# Patient Record
Sex: Female | Born: 1985 | Race: Black or African American | Hispanic: No | Marital: Single | Smoking: Never smoker
Health system: Southern US, Community
[De-identification: ages and names within clinical notes are randomized; demographics above are authoritative.]

## PROBLEM LIST (undated history)

## (undated) HISTORY — PX: COSMETIC SURGERY: SHX468

---
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
## 2007-11-30 ENCOUNTER — Ambulatory Visit (HOSPITAL_COMMUNITY): Admission: EM | Admit: 2007-11-30 | Discharge: 2007-11-30 | Payer: Self-pay | Admitting: Emergency Medicine

## 2009-05-26 ENCOUNTER — Inpatient Hospital Stay (HOSPITAL_COMMUNITY): Admission: AD | Admit: 2009-05-26 | Discharge: 2009-05-27 | Payer: Self-pay | Admitting: Obstetrics & Gynecology

## 2010-01-24 ENCOUNTER — Inpatient Hospital Stay (HOSPITAL_COMMUNITY)
Admission: AD | Admit: 2010-01-24 | Discharge: 2010-01-27 | Payer: Self-pay | Source: Home / Self Care | Attending: Obstetrics and Gynecology | Admitting: Obstetrics and Gynecology

## 2010-01-28 NOTE — Discharge Summary (Signed)
  Stephanie Gibbs, Stephanie Gibbs               ACCOUNT NO.:  [id number]  MEDICAL RECORD NO.:  [id number]          PATIENT TYPE:  INP  LOCATION:  9134                          FACILITY:  [facility]  PHYSICIAN:  Huel Cote, M.D. DATE OF BIRTH:  October 08, 1985  DATE OF ADMISSION:  01/24/2010 DATE OF DISCHARGE:  01/27/2010                              DISCHARGE SUMMARY   DISCHARGE DIAGNOSES: 1. Term pregnancy at 80 plus weeks' delivered. 2. Status post normal spontaneous vaginal delivery.  DISCHARGE MEDICATIONS: 1. Motrin 600 mg p.o. every 6 h. 2. Percocet 1-2 tablets p.o. every 4 h. p.r.n.  DISCHARGE FOLLOWUP:  The patient is to follow up in the office in 6 weeks for her full postpartum exam.  HOSPITAL COURSE:  The patient is a 25 year old, G1, P0, who came in at 71 weeks' gestation when she was in latent phase labor.  She had actually been instructed to report from the office; however, went home and came in approximately 5 hours later.  Her prenatal care was essentially uncomplicated except for an isolated echogenic intracardiac focus.  PRENATAL LABORATORY DATA:  Are as follows; O positive, antibody negative, RPR nonreactive, rubella immune, hepatitis B surface antigen negative, HIV negative, GC negative, Chlamydia negative, hemoglobin electrophoresis AA.  Her first trimester screen was normal.  Her AFP was normal, 1-hour Glucola normal at 89 and group B strep was negative.  PAST MEDICAL HISTORY:  Significant for eczema.  PAST SURGICAL HISTORY:  Breast reduction.  PAST OBSTETRICAL HISTORY:  None.  On admission, she was afebrile with stable vital signs.  Fetal heart rate was reactive.  Cervix was 4 and 80 at a -2.  She had rupture of membranes after admission and had been augmented with Pitocin.  She over 2 hours very quickly progressed to complete dilation and pushed well. She had normal spontaneous vaginal delivery of a viable female infant, Apgars were 8 and 9, weight was 6 pounds 8  ounces.  She had a small first-degree laceration repaired with 3-0 Vicryl and a small anterior laceration on the cervix which did not require any suturing and was hemostatic.  She was then kept for routine postpartum care.  On postpartum day #1, she was doing well with no complaints.  Fundus was firm.  On postpartum day #2, her lochia had decreased substantially. She has remained afebrile with stable vital signs and was felt stable for discharge home.  She was given instructions on pelvic rest and to call the office next week to set up her 6-week postpartum visit.     Huel Cote, M.D.     KR/MEDQ  D:  01/27/2010  T:  01/27/2010  Job:  [id number]  Electronically Signed by Huel Cote M.D. on 01/28/2010 10:22:20 AM

## 2010-01-29 LAB — CBC
MCH: 27.7 pg (ref 26.0–34.0)
MCHC: 33.3 g/dL (ref 30.0–36.0)
Platelets: 209 10*3/uL (ref 150–400)
RBC: 4.44 MIL/uL (ref 3.87–5.11)

## 2010-03-26 LAB — URINALYSIS, ROUTINE W REFLEX MICROSCOPIC
Bilirubin Urine: NEGATIVE
Glucose, UA: 100 mg/dL — AB
Nitrite: NEGATIVE
Protein, ur: NEGATIVE mg/dL
Specific Gravity, Urine: >1.03 — ABNORMAL HIGH (ref 1.005–1.030)
pH: 6 (ref 5.0–8.0)

## 2010-03-26 LAB — ABO/RH: ABO/RH(D): O POS

## 2010-03-26 LAB — CBC
HCT: 34.7 % — ABNORMAL LOW (ref 36.0–46.0)
Hemoglobin: 11.7 g/dL — ABNORMAL LOW (ref 12.0–15.0)
Platelets: 179 10*3/uL (ref 150–400)
RBC: 4.08 MIL/uL (ref 3.87–5.11)
RDW: 13.4 % (ref 11.5–15.5)
WBC: 4.1 10*3/uL (ref 4.0–10.5)

## 2010-03-26 LAB — WET PREP, GENITAL
Trich, Wet Prep: NONE SEEN
Yeast Wet Prep HPF POC: NONE SEEN

## 2010-03-26 LAB — POCT PREGNANCY, URINE: Preg Test, Ur: POSITIVE

## 2010-05-22 NOTE — Op Note (Signed)
[name], Stephanie Gibbs               ACCOUNT NO.:  [id number]   MEDICAL RECORD NO.:  [id number]          PATIENT TYPE:  INP   LOCATION:  0103                         FACILITY:  [facility]   PHYSICIAN:  Etter Sjogren, M.D.     DATE OF BIRTH:  05-04-1985   DATE OF PROCEDURE:  11/30/2007  DATE OF DISCHARGE:                               OPERATIVE REPORT   PREOPERATIVE DIAGNOSIS:  Left breast hematoma, status post bilateral  breast reduction two weeks ago.   POSTOPERATIVE DIAGNOSIS:  Left breast hematoma, status post bilateral  breast reduction two weeks ago.   PROCEDURE PERFORMED:  Incision and drainage of a left breast hematoma.   SURGEON:  Etter Sjogren, M.D.   ANESTHESIA:  General.   ESTIMATED BLOOD LOSS:  Minimal with the exception of the evacuation of  the hematoma.   CLINICAL NOTE:  A 25 year old woman had a breast reduction almost 2  weeks ago.  She has done extremely well.  She was running for her cell  phone last night and felt a sudden pain in her left breast and developed  immediate swelling in the left breast.  On evaluation, it is felt that  she did have a hematoma.  It was very soft.   The procedure and risks, plus complications, were discussed with her in  great detail, including but not limited to, bleeding, infection,  anesthesia complications, healing problems, scarring, damage to nipple,  loss of sensation, asymmetry, poor results, and further bleeding.  She  understood all of this and the importance of limiting her activities.   PROCEDURE:  Patient was taken to the operating room and placed supine.  After a successful induction of general anesthesia, prepped with  Betadine and draped with sterile drapes.  The lateral aspect of the  incision was opened, and a fairly significant hematoma was noted.  The  medial aspect as well as the vertical T portion opened so there could be  good visualization all the way around the breast nipple pedicle.  The  hematoma was  evacuated.  It was approximately 250 to 300 cc of blood.  Thorough irrigation with saline.  Meticulous hemostasis with Bovie  electrocautery.  There was no frank bleeding vessel found, just some  punctate bleeding from the opening.  After careful inspection and after  thoroughly irrigating with saline and meticulous hemostasis with  electrocautery, a Blake drain was positioned and brought through a  separate stab wound inferolaterally and secured with 3-0 Prolene suture.  Nipple complex and all skin edges looked very healthy, and closure with  3-0 Monocryl interrupted deep dermal sutures, 3-0 Monocryl running  subcuticular sutures.  Steri-Strips, dry sterile dressing, and a  circumferential Ace wrap applied.  She was transferred to the recovery  room stable, having tolerated the procedure well.   DISPOSITION:  She will call the office for an appointment in a week.  She will be on antibiotics, Keflex 5 mg p.o. q.i.d., and we will see her  back in the office next week.      Etter Sjogren, M.D.  Electronically Signed     DB/MEDQ  D:  11/30/2007  T:  11/30/2007  Job:  [id number]

## 2010-10-10 LAB — DIFFERENTIAL
Basophils Absolute: 0
Basophils Relative: 1
Lymphs Abs: 1.4
Monocytes Absolute: 0.3
Monocytes Relative: 5
Neutrophils Relative %: 72

## 2010-10-10 LAB — CBC
Hemoglobin: 10.6 — ABNORMAL LOW
MCHC: 33.1
MCV: 84.4
Platelets: 261
RDW: 13.3

## 2010-10-10 LAB — URINALYSIS, ROUTINE W REFLEX MICROSCOPIC
Bilirubin Urine: NEGATIVE
Glucose, UA: NEGATIVE
Hgb urine dipstick: NEGATIVE
Ketones, ur: NEGATIVE
Nitrite: NEGATIVE
Specific Gravity, Urine: 1.025

## 2010-10-10 LAB — POCT I-STAT, CHEM 8
Calcium, Ion: 1.18
HCT: 33 — ABNORMAL LOW
Hemoglobin: 11.2 — ABNORMAL LOW
TCO2: 23

## 2011-01-06 IMAGING — US US OB COMP LESS 14 WK
1 series · 13 of 25 positions shown · non-contrast
Comparison: None.

CLINICAL DATA: Abdominal cramping for 3 weeks; persistent headache
and back pain.  Vaginal discharge.

OBSTETRIC <14 WK US AND TRANSVAGINAL OB US
TECHNIQUE: Both transabdominal and transvaginal ultrasound
examinations were performed for complete evaluation of the
gestation as well as the maternal uterus, adnexal regions, and
pelvic cul-de-sac.

[Series 1: us ob comp less 14 wks · 0.21mm/px · 13 of 25 slices shown]
[im 1/25]
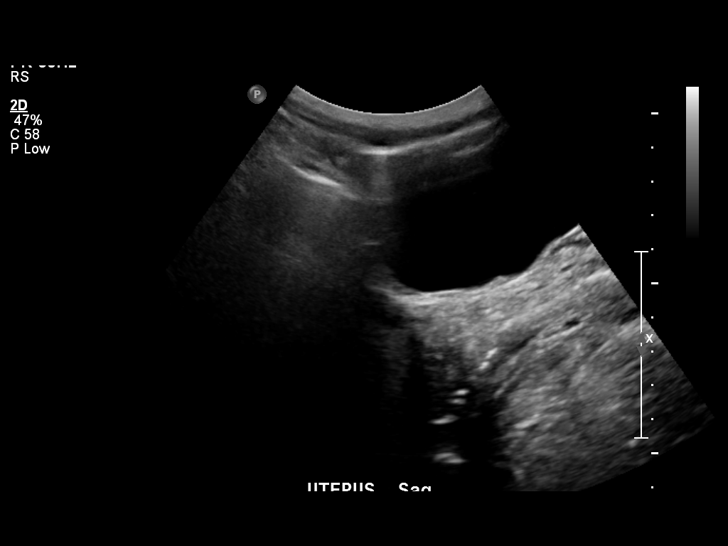
[im 3/25]
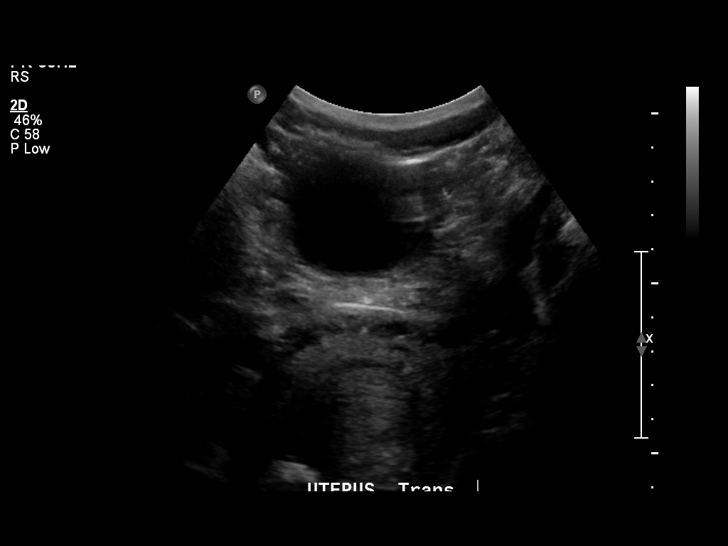
[im 5/25]
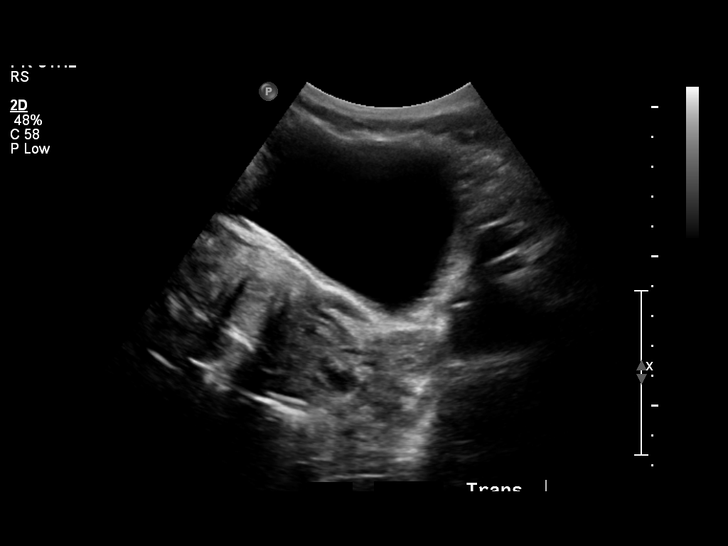
[im 7/25]
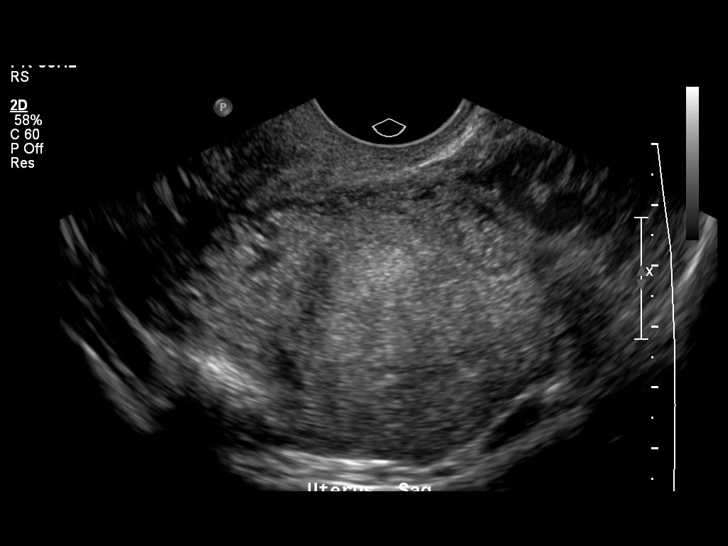
[im 9/25]
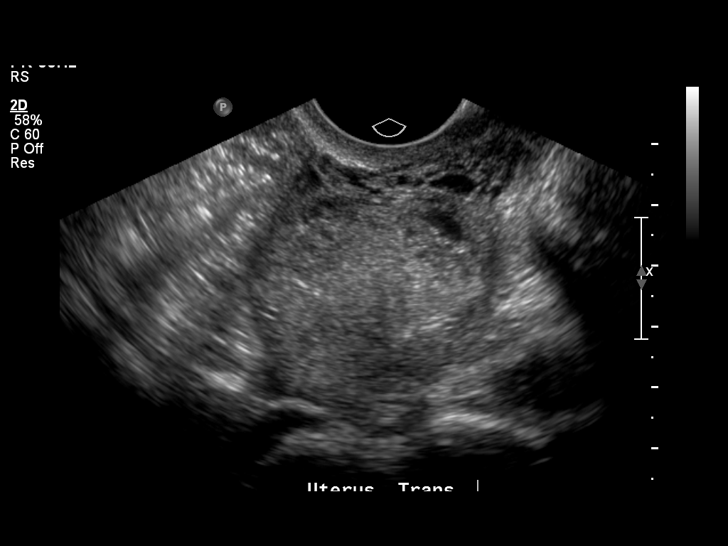
[im 11/25]
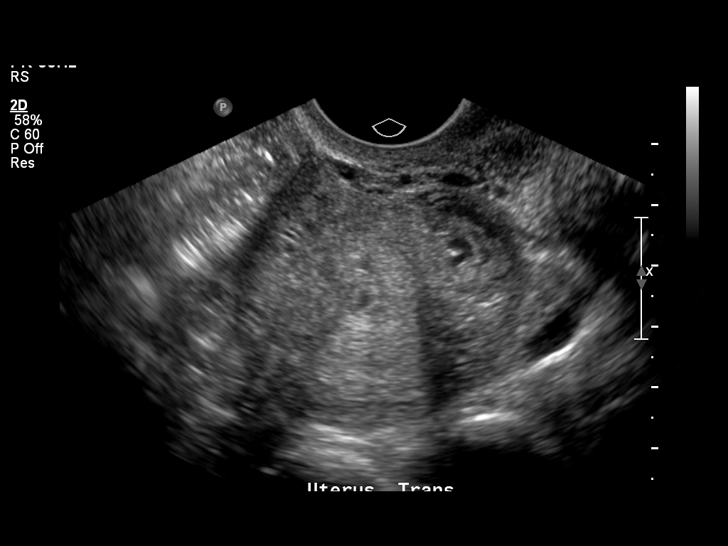
[im 13/25]
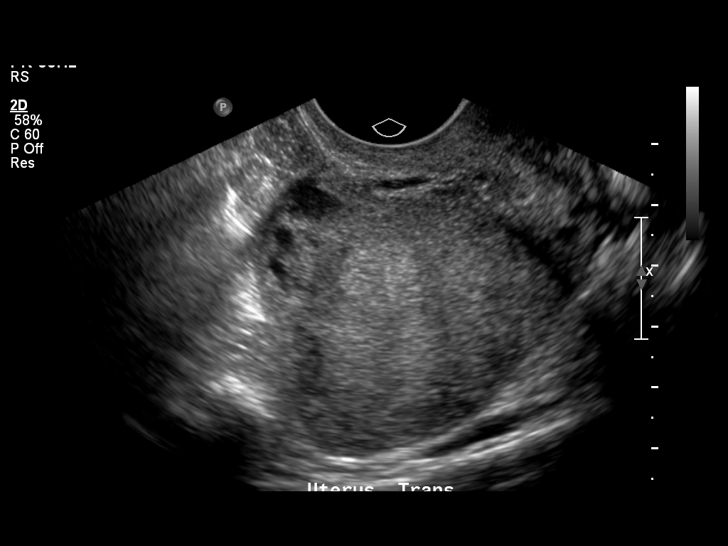
[im 15/25]
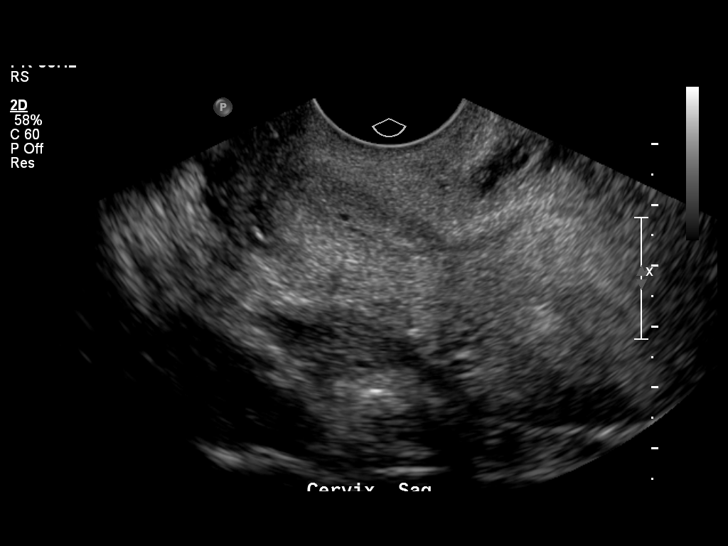
[im 17/25]
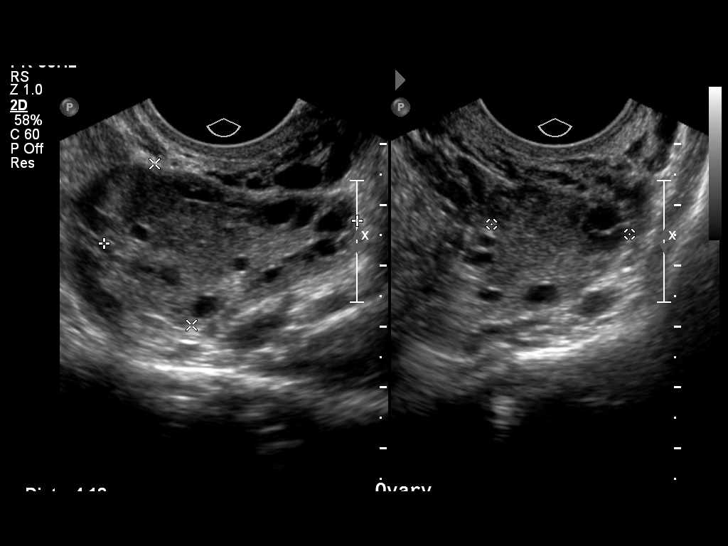
[im 19/25]
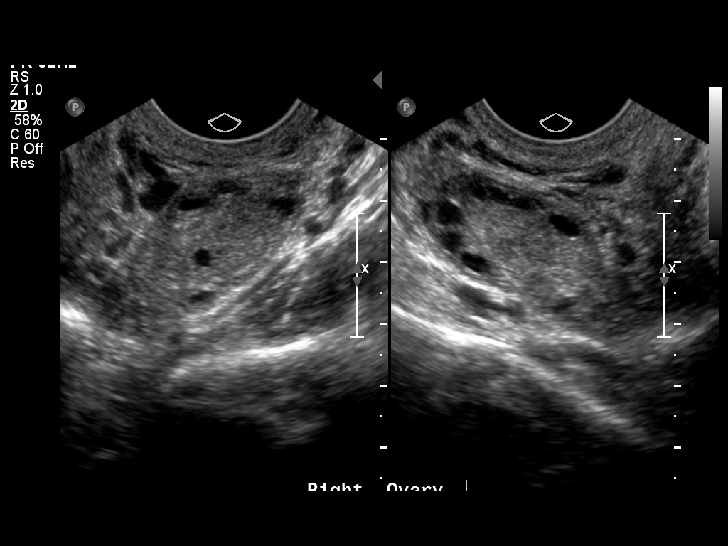
[im 21/25]
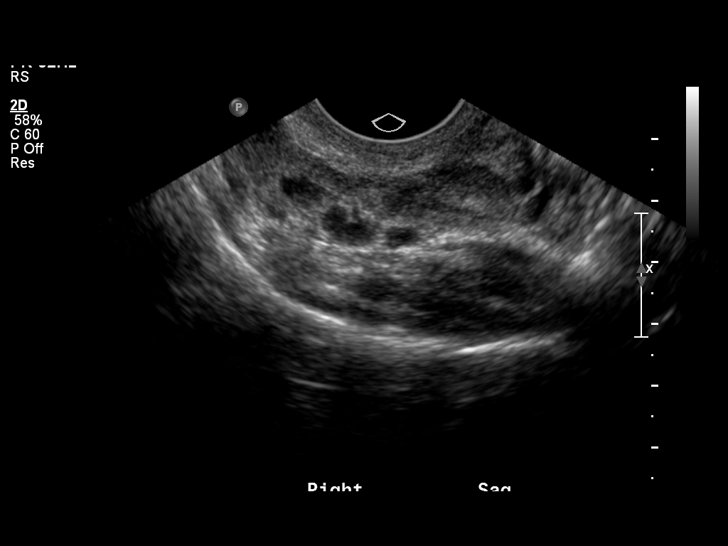
[im 23/25]
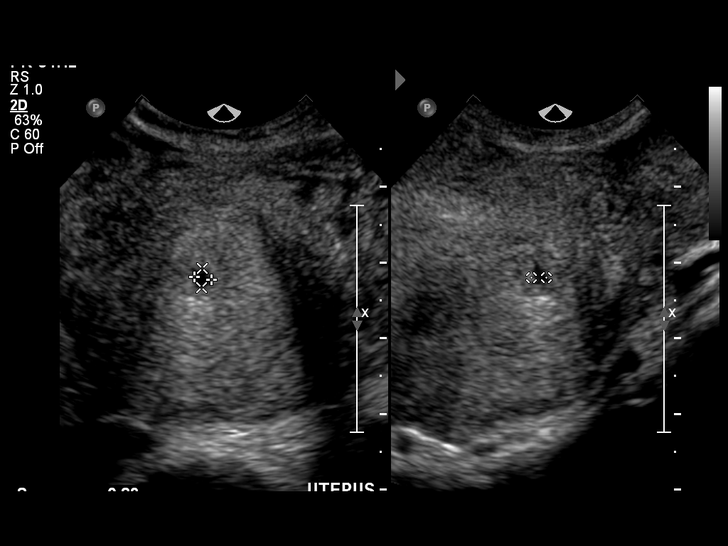
[im 25/25]
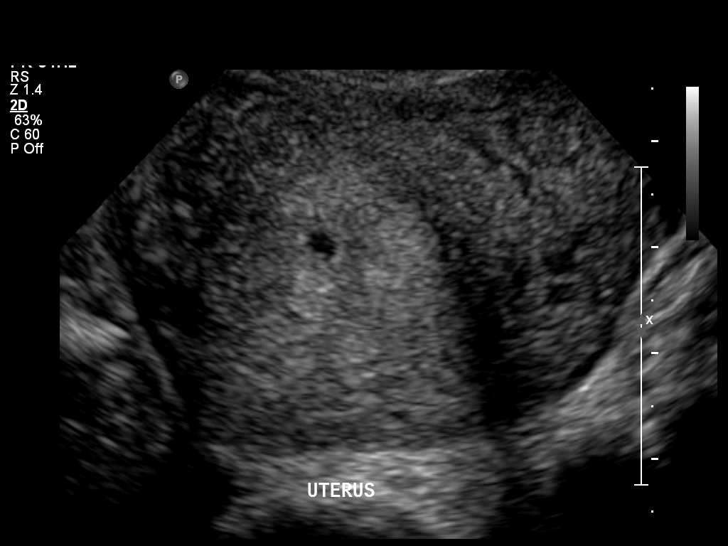

[13 of 25 positions shown; findings below may reference images not displayed]

FINDINGS: A single likely intrauterine gestational sac is identified.  It
measures 2.3 mm in mean sac diameter, corresponding to a
gestational age of 4 weeks 5 days.  This does not match the
patient's expected gestational age of 8 weeks 6 days by LMP.  A
possible yolk sac is noted.   An embryo is not yet seen.

This could simply reflect inaccurate dates; suggest following
serial beta HCGs to ensure that levels continue to rise. Myometrial
echogenicity is within normal limits.  No fibroids are identified.
There is no evidence of subchorionic hemorrhage.

The ovaries are unremarkable in appearance.  The right ovary
measures 3.0 x 1.8 x 2.3 cm, while the left ovary measures 4.2 x
2.7 x 2.3 cm.  No suspicious adnexal masses are seen.  There is no
evidence for ovarian torsion.

No free fluid is seen within the pelvic cul-de-sac.
IMPRESSION: Single likely intrauterine gestational sac noted, measuring 2.3 mm
in mean sac diameter, corresponding to a gestational age of 4 weeks
5 days.  This does not match the patient's expected gestational age
by LMP.

It could simply reflect inaccurate dates; suggest following serial
beta HCGs to ensure that levels continue to rise.  This gestational
age reflects an estimated date of delivery January 29, 2010.

## 2011-01-10 ENCOUNTER — Encounter (HOSPITAL_COMMUNITY): Payer: Self-pay | Admitting: Obstetrics and Gynecology

## 2011-01-10 ENCOUNTER — Inpatient Hospital Stay (HOSPITAL_COMMUNITY)
Admission: AD | Admit: 2011-01-10 | Discharge: 2011-01-10 | Disposition: A | Discharge status: Home / Self Care | Payer: Self-pay | Source: Ambulatory Visit | Attending: Obstetrics & Gynecology | Admitting: Obstetrics & Gynecology

## 2011-01-10 DIAGNOSIS — A088 Other specified intestinal infections: Secondary | ICD-10-CM | POA: Insufficient documentation

## 2011-01-10 DIAGNOSIS — K5289 Other specified noninfective gastroenteritis and colitis: Secondary | ICD-10-CM

## 2011-01-10 DIAGNOSIS — K529 Noninfective gastroenteritis and colitis, unspecified: Secondary | ICD-10-CM

## 2011-01-10 LAB — URINALYSIS, ROUTINE W REFLEX MICROSCOPIC
Glucose, UA: NEGATIVE mg/dL
Hgb urine dipstick: NEGATIVE
Leukocytes, UA: NEGATIVE
Nitrite: NEGATIVE
Protein, ur: NEGATIVE mg/dL
Urobilinogen, UA: 0.2 mg/dL (ref 0.0–1.0)

## 2011-01-10 MED ORDER — PROMETHAZINE HCL 25 MG PO TABS: 25.0000 mg | ORAL_TABLET | Freq: Four times a day (QID) | ORAL | Status: AC | PRN

## 2011-01-10 MED ORDER — DIPHENOXYLATE-ATROPINE 2.5-0.025 MG PO TABS: 1.0000 | ORAL_TABLET | Freq: Four times a day (QID) | ORAL | Status: AC | PRN

## 2011-01-10 MED ORDER — ONDANSETRON 8 MG PO TBDP
8.0000 mg | ORAL_TABLET | Freq: Once | ORAL | Status: AC
  Administered 2011-01-10: 8 mg via ORAL
  Filled 2011-01-10: qty 1

## 2011-01-10 NOTE — Progress Notes (Signed)
Pt  Re-assessed - says vomited x5 at home-  No vomiting here, no diarrhea, NO FEVER.Marland Kitchen  BACK TO LOBBY.

## 2011-01-10 NOTE — Progress Notes (Signed)
PT  BROUGHT TO TRIAGE - WANTING TO KNOW- WHEN SHE WOULD BE SEED.  SHE SAYS  SHE FEELS BETTER- HAS NOT VOMITED / DIARRHEA- SINCE 4 PM.  MAY LEAVE TO PICK UP CHILD.

## 2011-01-10 NOTE — Progress Notes (Signed)
ASKING QUESTIONS ABOUT  N/V/D- HAS HAD NONE   SAYS NEEDS TO LEAVE  BY 10PM   TO PICKUP CHILD.Marland Kitchen  BACK TO LOBBY

## 2011-01-10 NOTE — ED Provider Notes (Signed)
History     Chief Complaint  Patient presents with  . Diarrhea  . Emesis   HPI N/V/D since 6 AM, feeling better now. No fever or chills.   No past medical history on file.  No past surgical history on file.  No family history on file.  History  Substance Use Topics  . Smoking status: Not on file  . Smokeless tobacco: Not on file  . Alcohol Use: Not on file    Allergies: No Known Allergies  No prescriptions prior to admission    Review of Systems  Constitutional: Negative.   Respiratory: Negative.   Cardiovascular: Negative.   Gastrointestinal: Positive for nausea, vomiting, abdominal pain and diarrhea. Negative for constipation.  Genitourinary: Negative for dysuria, urgency, frequency, hematuria and flank pain.       Negative for vaginal bleeding, vaginal discharge, dyspareunia  Musculoskeletal: Negative.   Neurological: Negative.   Psychiatric/Behavioral: Negative.    Physical Exam   Blood pressure 120/81, pulse 82, temperature 98.8 F (37.1 C), temperature source Oral, resp. rate 18, last menstrual period 01/06/2011.  Physical Exam  Nursing note and vitals reviewed. Constitutional: She is oriented to person, place, and time. She appears well-developed and well-nourished. No distress.  Cardiovascular: Normal rate and regular rhythm.   Respiratory: Effort normal.  GI: Soft. Bowel sounds are normal. She exhibits no distension and no mass. There is no tenderness. There is no rebound and no guarding.  Musculoskeletal: Normal range of motion.  Neurological: She is alert and oriented to person, place, and time.  Skin: Skin is warm and dry.  Psychiatric: She has a normal mood and affect.    MAU Course  Procedures  Results for orders placed during the hospital encounter of 01/10/11 (from the past 24 hour(s))  URINALYSIS, ROUTINE W REFLEX MICROSCOPIC     Status: Abnormal   Collection Time   01/10/11  6:30 PM      Component Value Range   Color, Urine YELLOW   YELLOW    APPearance CLEAR  CLEAR    Specific Gravity, Urine >1.030 (*) 1.005 - 1.030    pH 6.0  5.0 - 8.0    Glucose, UA NEGATIVE  NEGATIVE (mg/dL)   Hgb urine dipstick NEGATIVE  NEGATIVE    Bilirubin Urine SMALL (*) NEGATIVE    Ketones, ur 40 (*) NEGATIVE (mg/dL)   Protein, ur NEGATIVE  NEGATIVE (mg/dL)   Urobilinogen, UA 0.2  0.0 - 1.0 (mg/dL)   Nitrite NEGATIVE  NEGATIVE    Leukocytes, UA NEGATIVE  NEGATIVE   POCT PREGNANCY, URINE     Status: Normal   Collection Time   01/10/11  6:38 PM      Component Value Range   Preg Test, Ur NEGATIVE     Tolerating PO fluids after Zofran ODT   Assessment and Plan  26 y.o. [id number] with viral gastroenteritis Rx phenergan and lomotil F/U PRN  Sixto Bowdish 01/11/2011, 1:42 AM

## 2011-01-10 NOTE — Progress Notes (Signed)
Pt Presents to MAU with chief complaint of N/V this morning. Pt says she had several episodes >5 of vomiting and diarrhea. No fever; unsure if its food related or virus. No vomiting/diarrhea since here in MAU, no fever.

## 2011-01-10 NOTE — Progress Notes (Addendum)
Started @ 6a Vomiting and nausea this AM x 4-5 times, diarrhea, x 2, ate last night @ 2000 Pain: body aches, pain scale 7/10, feels chills off and on, afebrile

## 2011-01-11 NOTE — ED Provider Notes (Signed)
Attestation of Attending Supervision of Advanced Practitioner: Evaluation and management procedures were performed by the PA/NP/CNM/OB Fellow under my supervision/collaboration. Chart reviewed, and agree with management and plan.  Britney Newstrom, M.D. 01/11/2011 6:38 AM   

## 2012-03-24 ENCOUNTER — Encounter: Payer: Self-pay | Admitting: Certified Nurse Midwife

## 2012-04-07 ENCOUNTER — Ambulatory Visit (INDEPENDENT_AMBULATORY_CARE_PROVIDER_SITE_OTHER): Payer: 59 | Admitting: Physician Assistant

## 2012-04-07 VITALS — BP 110/80 | HR 66 | Temp 98.1°F | Resp 18 | Ht 64.0 in | Wt 103.0 lb

## 2012-04-07 DIAGNOSIS — Z2089 Contact with and (suspected) exposure to other communicable diseases: Secondary | ICD-10-CM

## 2012-04-07 DIAGNOSIS — Z202 Contact with and (suspected) exposure to infections with a predominantly sexual mode of transmission: Secondary | ICD-10-CM

## 2012-04-07 DIAGNOSIS — N760 Acute vaginitis: Secondary | ICD-10-CM

## 2012-04-07 DIAGNOSIS — R350 Frequency of micturition: Secondary | ICD-10-CM

## 2012-04-07 LAB — POCT WET PREP WITH KOH
KOH Prep POC: NEGATIVE
Trichomonas, UA: NEGATIVE
Yeast Wet Prep HPF POC: NEGATIVE

## 2012-04-07 LAB — POCT URINALYSIS DIPSTICK
Bilirubin, UA: NEGATIVE
Blood, UA: NEGATIVE
Glucose, UA: NEGATIVE
Ketones, UA: NEGATIVE
Leukocytes, UA: NEGATIVE
Nitrite, UA: NEGATIVE
Protein, UA: NEGATIVE
Spec Grav, UA: 1.02
Urobilinogen, UA: 0.2
pH, UA: 7.5

## 2012-04-07 LAB — POCT UA - MICROSCOPIC ONLY
Casts, Ur, LPF, POC: NEGATIVE
Crystals, Ur, HPF, POC: NEGATIVE
Mucus, UA: NEGATIVE
Yeast, UA: NEGATIVE

## 2012-04-07 MED ORDER — METRONIDAZOLE 500 MG PO TABS: 500.0000 mg | ORAL_TABLET | Freq: Two times a day (BID) | ORAL | Status: DC

## 2012-04-07 NOTE — Progress Notes (Signed)
Subjective:    Patient ID: Stephanie Gibbs, female    DOB: 11-Nov-1985, 27 y.o.   MRN: [id number]  HPI   Stephanie Gibbs is a very pleasant 27 yr old female here with concern about vaginal discharge and requesting STD testing.  States she "made a bad decision a few weeks ago."  Had unprotected sex about 6 weeks ago.  States she has not felt the same since that time.  Has noted some pain in her left side, but most prominently has noticed a vaginal odor.  Has noted the odor since the encounter 6 weeks ago.  Could not get an appt with her ob/gyn for 2 weeks, which prompted her to come here.  Has noted a little more vaginal discharge than usual.  She did not use a condom with the last sexual encounter.  She is not using any form of contraception.  LMP about 03/24/12, normal.  Denies abd, vaginal, or pelvic pain.  Has noted some urinary frequency but no dysuria or hematuria.      Review of Systems  Constitutional: Negative for fever and chills.  HENT: Negative.   Respiratory: Negative.   Cardiovascular: Negative.   Gastrointestinal: Negative.   Genitourinary: Positive for frequency, flank pain and vaginal discharge. Negative for dysuria, hematuria and pelvic pain.  Musculoskeletal: Negative.   Skin: Negative.   Neurological: Negative.        Objective:   Physical Exam  Vitals reviewed. Constitutional: She is oriented to person, place, and time. She appears well-developed and well-nourished. No distress.  HENT:  Head: Normocephalic and atraumatic.  Eyes: Conjunctivae are normal. No scleral icterus.  Cardiovascular: Normal rate, regular rhythm and normal heart sounds.   Pulmonary/Chest: Effort normal and breath sounds normal. She has no wheezes. She has no rales.  Abdominal: Soft. Bowel sounds are normal. There is no tenderness. There is no CVA tenderness.  Genitourinary: Uterus normal. There is no rash, tenderness or lesion on the right labia. There is no rash, tenderness or lesion on the left  labia. Cervix exhibits no motion tenderness, no discharge and no friability. Right adnexum displays no mass, no tenderness and no fullness. Left adnexum displays no mass, no tenderness and no fullness. Vaginal discharge (copious, yellow/white) found.  Neurological: She is alert and oriented to person, place, and time.  Skin: Skin is warm and dry.  Psychiatric: She has a normal mood and affect. Her behavior is normal.     Filed Vitals:   04/07/12 1045  BP: 110/80  Pulse: 66  Temp: 98.1 F (36.7 C)  Resp: 18     Results for orders placed in visit on 04/07/12  POCT URINALYSIS DIPSTICK      Result Value Range   Color, UA yellow     Clarity, UA clear     Glucose, UA neg     Bilirubin, UA neg     Ketones, UA neg     Spec Grav, UA 1.020     Blood, UA neg     pH, UA 7.5     Protein, UA neg     Urobilinogen, UA 0.2     Nitrite, UA neg     Leukocytes, UA Negative    POCT UA - MICROSCOPIC ONLY      Result Value Range   WBC, Ur, HPF, POC 0-1     RBC, urine, microscopic 0-1     Bacteria, U Microscopic trace     Mucus, UA neg     Epithelial  cells, urine per micros 1-3     Crystals, Ur, HPF, POC neg     Casts, Ur, LPF, POC neg     Yeast, UA neg    POCT WET PREP WITH KOH      Result Value Range   Trichomonas, UA Negative     Clue Cells Wet Prep HPF POC 0-2     Epithelial Wet Prep HPF POC 3-6     Yeast Wet Prep HPF POC neg     Bacteria Wet Prep HPF POC 1+     RBC Wet Prep HPF POC 0-2     WBC Wet Prep HPF POC 8-12     KOH Prep POC Negative    POCT URINE PREGNANCY      Result Value Range   Preg Test, Ur Negative          Assessment & Plan:  Bacterial vaginosis - Plan: metroNIDAZOLE (FLAGYL) 500 MG tablet  -- Wet prep with few clue cells.  Given symptoms of vaginal discharge and odor, will treat with Flagyl x 7 days.  Discussed avoidance of etoh.  If symptoms fail to improve, pt will RTC.    Exposure to STD - Plan: POCT Wet Prep with KOH, POCT urine pregnancy, HIV antibody,  GC/Chlamydia Probe Amp, RPR  -- Genprobe, RPR, and HIV pending.  Will follow up and treat if necessary.  Discussed condom use with pt.  Frequency of urination - Plan: POCT urinalysis dipstick, POCT UA - Microscopic Only  -- UA does not indicate infection.  Etiology of urinary frequency is unclear.  If this persists, may need to evaluate further  Discussed all of this with pt who understands and is in agreement with this plan.

## 2012-04-07 NOTE — Patient Instructions (Addendum)
Begin taking the medication as directed.  Be sure to finish the full course.  DO NOT CONSUME ALCOHOL WITH THIS MEDICATION or for 48 hours after your last dose.  I will let you know when the rest of your labs are back.  If you feel like you are worsening or not improving, please let us know.   Bacterial Vaginosis Bacterial vaginosis (BV) is a vaginal infection where the normal balance of bacteria in the vagina is disrupted. The normal balance is then replaced by an overgrowth of certain bacteria. There are several different kinds of bacteria that can cause BV. BV is the most common vaginal infection in women of childbearing age. CAUSES   The cause of BV is not fully understood. BV develops when there is an increase or imbalance of harmful bacteria.  Some activities or behaviors can upset the normal balance of bacteria in the vagina and put women at increased risk including:  Having a new sex partner or multiple sex partners.  Douching.  Using an intrauterine device (IUD) for contraception.  It is not clear what role sexual activity plays in the development of BV. However, women that have never had sexual intercourse are rarely infected with BV. Women do not get BV from toilet seats, bedding, swimming pools or from touching objects around them.  SYMPTOMS   Grey vaginal discharge.  A fish-like odor with discharge, especially after sexual intercourse.  Itching or burning of the vagina and vulva.  Burning or pain with urination.  Some women have no signs or symptoms at all. DIAGNOSIS  Your caregiver must examine the vagina for signs of BV. Your caregiver will perform lab tests and look at the sample of vaginal fluid through a microscope. They will look for bacteria and abnormal cells (clue cells), a pH test higher than 4.5, and a positive amine test all associated with BV.  RISKS AND COMPLICATIONS   Pelvic inflammatory disease (PID).  Infections following gynecology  surgery.  Developing HIV.  Developing herpes virus. TREATMENT  Sometimes BV will clear up without treatment. However, all women with symptoms of BV should be treated to avoid complications, especially if gynecology surgery is planned. Female partners generally do not need to be treated. However, BV may spread between female sex partners so treatment is helpful in preventing a recurrence of BV.   BV may be treated with antibiotics. The antibiotics come in either pill or vaginal cream forms. Either can be used with nonpregnant or pregnant women, but the recommended dosages differ. These antibiotics are not harmful to the baby.  BV can recur after treatment. If this happens, a second round of antibiotics will often be prescribed.  Treatment is important for pregnant women. If not treated, BV can cause a premature delivery, especially for a pregnant woman who had a premature birth in the past. All pregnant women who have symptoms of BV should be checked and treated.  For chronic reoccurrence of BV, treatment with a type of prescribed gel vaginally twice a week is helpful. HOME CARE INSTRUCTIONS   Finish all medication as directed by your caregiver.  Do not have sex until treatment is completed.  Tell your sexual partner that you have a vaginal infection. They should see their caregiver and be treated if they have problems, such as a mild rash or itching.  Practice safe sex. Use condoms. Only have 1 sex partner. PREVENTION  Basic prevention steps can help reduce the risk of upsetting the natural balance of bacteria in  the vagina and developing BV:  Do not have sexual intercourse (be abstinent).  Do not douche.  Use all of the medicine prescribed for treatment of BV, even if the signs and symptoms go away.  Tell your sex partner if you have BV. That way, they can be treated, if needed, to prevent reoccurrence. SEEK MEDICAL CARE IF:   Your symptoms are not improving after 3 days of  treatment.  You have increased discharge, pain, or fever. MAKE SURE YOU:   Understand these instructions.  Will watch your condition.  Will get help right away if you are not doing well or get worse. FOR MORE INFORMATION  Division of STD Prevention (DSTDP), Centers for Disease Control and Prevention: [employer] American Social Health Association (ASHA): www.ashastd.org  Document Released: 12/24/2004 Document Revised: 03/18/2011 Document Reviewed: 06/16/2008 [facility] Patient Information 2013 [place], [place].

## 2012-04-08 LAB — HIV ANTIBODY (ROUTINE TESTING W REFLEX): HIV: NONREACTIVE

## 2012-04-08 LAB — RPR

## 2012-04-08 LAB — GC/CHLAMYDIA PROBE AMP
CT Probe RNA: NEGATIVE
GC Probe RNA: NEGATIVE

## 2012-04-14 ENCOUNTER — Ambulatory Visit: Payer: Self-pay | Admitting: Nurse Practitioner

## 2017-10-24 ENCOUNTER — Encounter (HOSPITAL_COMMUNITY): Payer: Self-pay | Admitting: Emergency Medicine

## 2017-10-24 ENCOUNTER — Ambulatory Visit (HOSPITAL_COMMUNITY)
Admission: EM | Admit: 2017-10-24 | Discharge: 2017-10-24 | Disposition: A | Discharge status: Home / Self Care | Payer: 59 | Attending: Family Medicine | Admitting: Family Medicine

## 2017-10-24 DIAGNOSIS — S46812A Strain of other muscles, fascia and tendons at shoulder and upper arm level, left arm, initial encounter: Secondary | ICD-10-CM

## 2017-10-24 MED ORDER — CYCLOBENZAPRINE HCL 10 MG PO TABS: 10.0000 mg | ORAL_TABLET | Freq: Two times a day (BID) | ORAL | 0 refills | Status: AC | PRN

## 2017-10-24 MED ORDER — IBUPROFEN 600 MG PO TABS: 600.0000 mg | ORAL_TABLET | Freq: Four times a day (QID) | ORAL | 0 refills | Status: AC | PRN
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
